# Patient Record
Sex: Female | Born: 2000 | Race: White | Hispanic: No | Marital: Single | Smoking: Never smoker
Health system: Southern US, Community
[De-identification: ages and names within clinical notes are randomized; demographics above are authoritative.]

## PROBLEM LIST (undated history)

## (undated) DIAGNOSIS — F32A Depression, unspecified: Secondary | ICD-10-CM

## (undated) DIAGNOSIS — F909 Attention-deficit hyperactivity disorder, unspecified type: Secondary | ICD-10-CM

## (undated) DIAGNOSIS — F329 Major depressive disorder, single episode, unspecified: Secondary | ICD-10-CM

---
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
## 2018-01-23 ENCOUNTER — Emergency Department (HOSPITAL_COMMUNITY): Admission: EM | Admit: 2018-01-23 | Discharge: 2018-01-23 | Discharge status: Against Medical Advice | Payer: Self-pay

## 2018-01-23 NOTE — ED Notes (Signed)
Patient up to desk.  States she "had something come up at home"  Appeared hurried.  This Rn encouraged her to return, especially if worsening.

## 2018-05-07 ENCOUNTER — Telehealth: Payer: Self-pay | Admitting: Licensed Clinical Social Worker

## 2018-05-07 NOTE — Telephone Encounter (Signed)
Called pt regarding scheduled appt. Unable to leave message phone kept ringing

## 2018-05-10 ENCOUNTER — Ambulatory Visit: Payer: Self-pay | Admitting: Family Medicine

## 2018-05-11 ENCOUNTER — Telehealth: Payer: Self-pay | Admitting: Licensed Clinical Social Worker

## 2018-05-11 NOTE — Telephone Encounter (Signed)
Called pt regarding no show appt. Unable to leave message

## 2019-08-08 ENCOUNTER — Other Ambulatory Visit: Payer: Self-pay

## 2019-08-08 ENCOUNTER — Emergency Department (HOSPITAL_BASED_OUTPATIENT_CLINIC_OR_DEPARTMENT_OTHER)
Admission: EM | Admit: 2019-08-08 | Discharge: 2019-08-08 | Disposition: A | Discharge status: Home / Self Care | Payer: Medicaid Other | Attending: Emergency Medicine | Admitting: Emergency Medicine

## 2019-08-08 ENCOUNTER — Encounter (HOSPITAL_BASED_OUTPATIENT_CLINIC_OR_DEPARTMENT_OTHER): Payer: Self-pay

## 2019-08-08 DIAGNOSIS — N760 Acute vaginitis: Secondary | ICD-10-CM | POA: Diagnosis not present

## 2019-08-08 DIAGNOSIS — B9689 Other specified bacterial agents as the cause of diseases classified elsewhere: Secondary | ICD-10-CM

## 2019-08-08 DIAGNOSIS — Z113 Encounter for screening for infections with a predominantly sexual mode of transmission: Secondary | ICD-10-CM | POA: Diagnosis present

## 2019-08-08 DIAGNOSIS — Z202 Contact with and (suspected) exposure to infections with a predominantly sexual mode of transmission: Secondary | ICD-10-CM

## 2019-08-08 LAB — URINALYSIS, ROUTINE W REFLEX MICROSCOPIC
Bilirubin Urine: NEGATIVE
Glucose, UA: NEGATIVE mg/dL
Ketones, ur: NEGATIVE mg/dL
Nitrite: NEGATIVE
Protein, ur: NEGATIVE mg/dL
Specific Gravity, Urine: 1.02 (ref 1.005–1.030)
pH: 6 (ref 5.0–8.0)

## 2019-08-08 LAB — URINALYSIS, MICROSCOPIC (REFLEX)

## 2019-08-08 LAB — WET PREP, GENITAL
Sperm: NONE SEEN
Trich, Wet Prep: NONE SEEN
Yeast Wet Prep HPF POC: NONE SEEN

## 2019-08-08 LAB — PREGNANCY, URINE: Preg Test, Ur: NEGATIVE

## 2019-08-08 MED ORDER — METRONIDAZOLE 500 MG PO TABS: 500.0000 mg | ORAL_TABLET | Freq: Two times a day (BID) | ORAL | 0 refills | Status: AC

## 2019-08-08 MED ORDER — DOXYCYCLINE HYCLATE 100 MG PO CAPS: 100.0000 mg | ORAL_CAPSULE | Freq: Two times a day (BID) | ORAL | 0 refills | Status: AC

## 2019-08-08 MED ORDER — CEFTRIAXONE SODIUM 500 MG IJ SOLR
500.0000 mg | Freq: Once | INTRAMUSCULAR | Status: AC
  Administered 2019-08-08: 20:00:00 500 mg via INTRAMUSCULAR
  Filled 2019-08-08: qty 500

## 2019-08-08 NOTE — Discharge Instructions (Addendum)
You will be notified of your gonorrhea and Chlamydia testing.  You did receive empiric antibiotics in emergency department.  Is important to [place] pain from sexual intercourse until your test have resulted.  If positive you will need to let your partner know was seen for intercourse for 1 week so you do not pass this back-and-forth.  Follow-up with your PCP for reevaluation.  Wet prep did show bacterial vaginosis.  This is not an STD however is an overgrowth of the normal bacteria that lives in the vagina.  I have given you an antibiotic called Flagyl.  You cannot take alcohol while taking this medication.

## 2019-08-08 NOTE — ED Provider Notes (Signed)
[facility] Provider Note   CSN: [id number] Arrival date & time: 08/08/19  1903     History Chief Complaint  Patient presents with  . Exposure to STD    Jenny [name] is a 19 y.o. female with no history significant for depression who presents for evaluation of STD check.  Patient states her sexual partner is currently being seen for penile discharge.  Denies any discharge herself.  Denies fever, chills, abdominal pain, diarrhea, dysuria, pelvic pain, vaginal pruritus.  Denies prior history of STDs.  She does not HIV and syphilis testing at this time.  Denies aggravating or alleviating factors.  Sexual active with female partners.   History obtained from patient and past medical records.  No interpreter is used.  HPI     Past Medical History:  Diagnosis Date  . ADHD (attention deficit hyperactivity disorder)   . Depression     Patient Active Problem List   Diagnosis Date Noted  . Major depression, recurrent ([place]) 06/12/2016  . 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 No pertinent surgical history.   OB History   No obstetric history on file.     Family History  Problem Relation Age of Onset  . Hypertension Maternal Grandmother   . Alcohol abuse Maternal Grandmother   . Alcohol abuse Maternal Grandfather   . Heart disease Paternal Grandmother   . Cancer Paternal Grandfather   . Alcohol abuse Maternal Aunt   . Alcohol abuse Maternal Uncle     Social History   Tobacco Use  . Smoking status: Never Smoker  . Smokeless tobacco: Never Used  Substance Use Topics  . Alcohol use: Yes    Comment: occ  . Drug use: Yes    Types: Marijuana    Home Medications Prior to Admission medications   Medication Sig Start Date End Date Taking? Authorizing Provider  doxycycline (VIBRAMYCIN) 100 MG capsule Take 1 capsule (100 mg total) by mouth 2 (two) times daily.  08/08/19   Emilian Stawicki A, PA-C  ibuprofen (ADVIL,MOTRIN) 200 MG tablet Take 400 mg by mouth every 6 (six) hours as needed for moderate pain (for knee pain).    [provider]  metroNIDAZOLE (FLAGYL) 500 MG tablet Take 1 tablet (500 mg total) by mouth 2 (two) times daily. 08/08/19   Nicha Hemann A, PA-C  polyethylene glycol (MIRALAX / GLYCOLAX) packet Take 17 g by mouth daily. 12/14/16   Jean Rosenthal, NP  triamcinolone ointment (KENALOG) 0.1 % Apply 1 application topically daily as needed (for eczema flare up).    [provider]  venlafaxine XR (EFFEXOR-XR) 75 MG 24 hr capsule Take 1 capsule (75 mg total) by mouth daily with breakfast. 06/14/16   Philipp Ovens, MD    Allergies    Vaseline [petrolatum]  Review of Systems   Review of Systems  Constitutional: Negative.   HENT: Negative.   Respiratory: Negative.   Cardiovascular: Negative.   Gastrointestinal: Negative.   Genitourinary: Negative.   Musculoskeletal: Negative.   Skin: Negative.   Neurological: Negative.   All other systems reviewed and are negative.   Physical Exam Updated Vital Signs BP 131/76 (BP Location: Left Arm)   Pulse 92   Temp 98.1 F (36.7 C) (Oral)   Resp 20   Ht 5\' 5"  (1.651 m)   Wt 65.3 kg   LMP 08/03/2019   SpO2 100%   BMI 23.96 kg/m  Physical Exam Vitals and nursing note reviewed. Exam conducted with a chaperone present.  Constitutional:      General: She is not in acute distress.    Appearance: She is well-developed. She is not ill-appearing, toxic-appearing or diaphoretic.  HENT:     Head: Normocephalic and atraumatic.     Mouth/Throat:     Mouth: Mucous membranes are moist.  Eyes:     Pupils: Pupils are equal, round, and reactive to light.  Cardiovascular:     Rate and Rhythm: Normal rate.     Pulses: Normal pulses.     Heart sounds: Normal heart sounds.  Pulmonary:     Effort: Pulmonary effort is normal. No respiratory distress.      Breath sounds: Normal breath sounds.  Abdominal:     General: Bowel sounds are normal. There is no distension.  Genitourinary:    Comments: Normal appearing external female genitalia without rashes or lesions, normal vaginal epithelium. Normal appearing cervix with white, brown discharge. No cervical petechiae. Cervical os is closed. There is no bleeding noted at the os. No Odor. Bimanual: No CMT,  nontender.  No palpable adnexal masses or tenderness. Uterus midline and not fixed. Rectovaginal exam was deferred.  No cystocele or rectocele noted. No pelvic lymphadenopathy noted. Wet prep was obtained.  Cultures for gonorrhea and chlamydia collected. Exam performed with chaperone in room. Musculoskeletal:        General: Normal range of motion.     Cervical back: Normal range of motion.  Skin:    General: Skin is warm and dry.  Neurological:     Mental Status: She is alert.    ED Results / Procedures / Treatments   Labs (all labs ordered are listed, but only abnormal results are displayed) Labs Reviewed  WET PREP, GENITAL - Abnormal; Notable for the following components:      Result Value   Clue Cells Wet Prep HPF POC PRESENT (*)    WBC, Wet Prep HPF POC MODERATE (*)    All other components within normal limits  URINALYSIS, ROUTINE W REFLEX MICROSCOPIC - Abnormal; Notable for the following components:   Hgb urine dipstick MODERATE (*)    Leukocytes,Ua TRACE (*)    All other components within normal limits  URINALYSIS, MICROSCOPIC (REFLEX) - Abnormal; Notable for the following components:   Bacteria, UA RARE (*)    All other components within normal limits  PREGNANCY, URINE  GC/CHLAMYDIA PROBE AMP ([place]) NOT AT [facility]    EKG None  Radiology No results found.  Procedures Procedures (including critical care time)  Medications Ordered in ED Medications  cefTRIAXone (ROCEPHIN) injection 500 mg (500 mg Intramuscular Given 08/08/19 2022)    ED Course  I have reviewed the  triage vital signs and the nursing notes.  Pertinent labs & imaging results that were available during my care of the patient were reviewed by me and considered in my medical decision making (see chart for details).  19 year old female appears otherwise well presents for evaluation of STD screen.  Patient is not currently having any symptoms however her partner is having some discharge.  No prior history of STDs.  Patient does not want HIV and syphilis testing at this time.  On GU exam there is some white, light brown discharge at cervical os.  No CMT or adnexal tenderness.  Low suspicion for PID, torsion, TOA.  Patient elects for empiric antibiotics for STDs at this time.  Urine with some trace leuks, rare bacteria,  pregnancy test negative, wet prep  Pt presents with concerns for possible STD.  Pt understands that they have GC/Chlamydia cultures pending and that they will need to inform all sexual partners if results return positive. Pt has been treated prophylactically with Doxy and Rocephin due to pts history, pelvic exam, and wet prep with increased WBCs. Pt not concerning for PID because hemodynamically stable and no cervical motion tenderness on pelvic exam. Pt has also been treated with Flagyl for Bacterial Vaginosis. Pt has been advised to not drink alcohol while on this medication.  Patient to be discharged with instructions to follow up with OBGYN/PCP. Discussed importance of using protection when sexually active.   The patient has been appropriately medically screened and/or stabilized in the ED. I have low suspicion for any other emergent medical condition which would require further screening, evaluation or treatment in the ED or require inpatient management.    MDM Rules/Calculators/A&P                       Final Clinical Impression(s) / ED Diagnoses Final diagnoses:  Possible exposure to STD  Bacterial vaginosis    Rx / DC Orders ED Discharge Orders         Ordered     doxycycline (VIBRAMYCIN) 100 MG capsule  2 times daily     08/08/19 2013    metroNIDAZOLE (FLAGYL) 500 MG tablet  2 times daily     08/08/19 2015           Anelle Parlow A, PA-C 08/08/19 2029    Arby Barrette, MD 08/19/19 1332

## 2019-08-08 NOTE — ED Triage Notes (Signed)
Pt states her sexual partner is being seen for penile d/c-pt denies sx-NAD-steady gait

## 2019-08-10 LAB — GC/CHLAMYDIA PROBE AMP (~~LOC~~) NOT AT ARMC
Chlamydia: NEGATIVE
Neisseria Gonorrhea: NEGATIVE
